# Patient Record
Sex: Male | Born: 1990 | Race: White | Hispanic: No | Marital: Single | Smoking: Current every day smoker
Health system: Southern US, Community
[De-identification: ages and names within clinical notes are randomized; demographics above are authoritative.]

---
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
## 2015-10-18 ENCOUNTER — Encounter (HOSPITAL_COMMUNITY): Payer: Self-pay | Admitting: Emergency Medicine

## 2015-10-18 DIAGNOSIS — F17200 Nicotine dependence, unspecified, uncomplicated: Secondary | ICD-10-CM | POA: Insufficient documentation

## 2015-10-18 DIAGNOSIS — K2970 Gastritis, unspecified, without bleeding: Secondary | ICD-10-CM | POA: Insufficient documentation

## 2015-10-18 LAB — URINALYSIS, ROUTINE W REFLEX MICROSCOPIC
GLUCOSE, UA: NEGATIVE mg/dL
HGB URINE DIPSTICK: NEGATIVE
KETONES UR: 15 mg/dL — AB
LEUKOCYTES UA: NEGATIVE
Nitrite: NEGATIVE
PROTEIN: 30 mg/dL — AB
Specific Gravity, Urine: 1.036 — ABNORMAL HIGH (ref 1.005–1.030)
pH: 6 (ref 5.0–8.0)

## 2015-10-18 LAB — COMPREHENSIVE METABOLIC PANEL
ALK PHOS: 62 U/L (ref 38–126)
ALT: 13 U/L — AB (ref 17–63)
AST: 18 U/L (ref 15–41)
Albumin: 4.2 g/dL (ref 3.5–5.0)
Anion gap: 8 (ref 5–15)
BUN: 16 mg/dL (ref 6–20)
CALCIUM: 9.7 mg/dL (ref 8.9–10.3)
CO2: 25 mmol/L (ref 22–32)
CREATININE: 0.8 mg/dL (ref 0.61–1.24)
Chloride: 106 mmol/L (ref 101–111)
Glucose, Bld: 98 mg/dL (ref 65–99)
Potassium: 3.3 mmol/L — ABNORMAL LOW (ref 3.5–5.1)
Sodium: 139 mmol/L (ref 135–145)
Total Bilirubin: 0.7 mg/dL (ref 0.3–1.2)
Total Protein: 7 g/dL (ref 6.5–8.1)

## 2015-10-18 LAB — CBC
HCT: 45.3 % (ref 39.0–52.0)
Hemoglobin: 15.8 g/dL (ref 13.0–17.0)
MCH: 30.7 pg (ref 26.0–34.0)
MCHC: 34.9 g/dL (ref 30.0–36.0)
MCV: 88 fL (ref 78.0–100.0)
PLATELETS: 131 10*3/uL — AB (ref 150–400)
RBC: 5.15 MIL/uL (ref 4.22–5.81)
RDW: 13.1 % (ref 11.5–15.5)
WBC: 8.1 10*3/uL (ref 4.0–10.5)

## 2015-10-18 LAB — URINE MICROSCOPIC-ADD ON: RBC / HPF: NONE SEEN RBC/hpf (ref 0–5)

## 2015-10-18 LAB — LIPASE, BLOOD: Lipase: 23 U/L (ref 11–51)

## 2015-10-18 NOTE — ED Notes (Signed)
To or 

## 2015-10-18 NOTE — ED Notes (Signed)
The  Or is ready for the pt as soon as the cardiology  Is finished.  Pt just voided

## 2015-10-18 NOTE — ED Notes (Signed)
Pt. reports nausea , emesis and RUQ pain onset last week , denies diarrhea or fever .

## 2015-10-19 ENCOUNTER — Emergency Department (HOSPITAL_COMMUNITY)
Admission: EM | Admit: 2015-10-19 | Discharge: 2015-10-19 | Disposition: A | Discharge status: Home / Self Care | Payer: Self-pay | Attending: Emergency Medicine | Admitting: Emergency Medicine

## 2015-10-19 DIAGNOSIS — R112 Nausea with vomiting, unspecified: Secondary | ICD-10-CM

## 2015-10-19 DIAGNOSIS — K2970 Gastritis, unspecified, without bleeding: Secondary | ICD-10-CM

## 2015-10-19 MED ORDER — ONDANSETRON 4 MG PO TBDP: 4.0000 mg | ORAL_TABLET | Freq: Three times a day (TID) | ORAL | Status: AC | PRN

## 2015-10-19 MED ORDER — OMEPRAZOLE 20 MG PO CPDR: 20.0000 mg | DELAYED_RELEASE_CAPSULE | Freq: Two times a day (BID) | ORAL | Status: AC

## 2015-10-19 MED ORDER — ONDANSETRON 4 MG PO TBDP
4.0000 mg | ORAL_TABLET | Freq: Once | ORAL | Status: AC
  Administered 2015-10-19: 4 mg via ORAL
  Filled 2015-10-19: qty 1

## 2015-10-19 MED ORDER — PANTOPRAZOLE SODIUM 40 MG PO TBEC
40.0000 mg | DELAYED_RELEASE_TABLET | Freq: Every day | ORAL | Status: DC
  Administered 2015-10-19: 40 mg via ORAL
  Filled 2015-10-19: qty 1

## 2015-10-19 NOTE — ED Provider Notes (Signed)
CSN: [id number]     Arrival date & time 10/18/15  2128 History  By signing my name below, I, Bethel BornBritney McCollum, attest that this documentation has been prepared under the direction and in the presence of Rolland PorterMark Harshil Cavallaro, MD. Electronically Signed: Bethel BornBritney McCollum, ED Scribe. 10/19/2015. 1:06 AM   Chief Complaint  Patient presents with  . Abdominal Pain  . Emesis   The history is provided by the patient. No language interpreter was used.   Mark Kaiser is a 25 y.o. male who presents to the Emergency Department complaining of recurrent nausea and vomiting with onset yesterday. Pt states that he is unable to eat more than 3 bites of solid food without nausea and emesis, though he is able to tolerate candy. He states that he has similar symptoms near monthly that last for 3-4 weeks at a time. He associates his symptoms with his history of heavy marijuana use. Prior to 1 week ago he smoked an ounce per week for the last 8 years. 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 He no longer uses alcohol. He takes 1000 mg of ibuprofen approximately once per week for headaches. He drinks "at least 10 cans" of [facility] daily.  History reviewed. No pertinent past medical history. History reviewed. No pertinent past surgical history. No family history on file. Social History  Substance Use Topics  . Smoking status: Current Every Day Smoker  . Smokeless tobacco: None  . Alcohol Use: No    Review of Systems  Constitutional: Negative for fever, chills, diaphoresis, appetite change and fatigue.  HENT: Negative for mouth sores, sore throat and trouble swallowing.   Eyes: Negative for visual disturbance.  Respiratory: Negative for cough, chest tightness, shortness of breath and wheezing.   Cardiovascular: Negative for chest pain.  Gastrointestinal: Positive for nausea and vomiting. Negative for abdominal pain, diarrhea and abdominal distention.  Endocrine: Negative for polydipsia,  polyphagia and polyuria.  Genitourinary: Negative for dysuria, frequency and hematuria.  Musculoskeletal: Negative for gait problem.  Skin: Negative for color change, pallor and rash.  Neurological: Negative for dizziness, syncope, light-headedness and headaches.  Hematological: Does not bruise/bleed easily.  Psychiatric/Behavioral: Negative for behavioral problems and confusion.   Allergies  Bee venom  Home Medications   Prior to Admission medications   Medication Sig Start Date End Date Taking? Authorizing Provider  omeprazole (PRILOSEC) 20 MG capsule Take 1 capsule (20 mg total) by mouth 2 (two) times daily. 10/19/15   Rolland PorterMark Krizia Flight, MD  ondansetron (ZOFRAN ODT) 4 MG disintegrating tablet Take 1 tablet (4 mg total) by mouth every 8 (eight) hours as needed for nausea. 10/19/15   Rolland PorterMark Godson Pollan, MD   BP 126/85 mmHg  Pulse 61  Temp(Src) 98.6 F (37 C) (Oral)  Resp 18  Ht 5\' 6"  (1.676 m)  Wt 122 lb 1 oz (55.367 kg)  BMI 19.71 kg/m2  SpO2 100% Physical Exam  Constitutional: He is oriented to person, place, and time. He appears well-developed and well-nourished. No distress.  HENT:  Head: Normocephalic.  Eyes: Conjunctivae are normal. Pupils are equal, round, and reactive to light. No scleral icterus.  Neck: Normal range of motion. Neck supple. No thyromegaly present.  Cardiovascular: Normal rate and regular rhythm.  Exam reveals no gallop and no friction rub.   No murmur heard. Pulmonary/Chest: Effort normal and breath sounds normal. No respiratory distress. He has no wheezes. He has no rales.  Abdominal: Soft. Bowel sounds are normal. He exhibits no  distension. There is no tenderness. There is no rebound.  Musculoskeletal: Normal range of motion.  Neurological: He is alert and oriented to person, place, and time.  Skin: Skin is warm and dry. No rash noted.  Psychiatric: He has a normal mood and affect. His behavior is normal.    ED Course  Procedures (including critical care  time) DIAGNOSTIC STUDIES: Oxygen Saturation is 100% on RA,  normal by my interpretation.    COORDINATION OF CARE: 1:02 AM Discussed treatment plan which includes lab work, antiemetic medication, and an antacid with pt at bedside and pt agreed to plan.  Labs Review Labs Reviewed  COMPREHENSIVE METABOLIC PANEL - Abnormal; Notable for the following:    Potassium 3.3 (*)    ALT 13 (*)    All other components within normal limits  CBC - Abnormal; Notable for the following:    Platelets 131 (*)    All other components within normal limits  URINALYSIS, ROUTINE W REFLEX MICROSCOPIC (NOT AT [facility]) - Abnormal; Notable for the following:    Color, Urine AMBER (*)    Specific Gravity, Urine 1.036 (*)    Bilirubin Urine SMALL (*)    Ketones, ur 15 (*)    Protein, ur 30 (*)    All other components within normal limits  URINE MICROSCOPIC-ADD ON - Abnormal; Notable for the following:    Squamous Epithelial / LPF 0-5 (*)    Bacteria, UA RARE (*)    Crystals CA OXALATE CRYSTALS (*)    All other components within normal limits  LIPASE, BLOOD    Imaging Review No results found. I have personally reviewed and evaluated these lab results as part of my medical decision-making.   EKG Interpretation None      MDM   Final diagnoses:  Non-intractable vomiting with nausea, vomiting of unspecified type  Gastritis   Recurrent vomiting in a young male that abuses THC, caffeine, and anti-inflammatory medications. Normal enzymes and reassuring labs. Plan: I talked to him about THC hyperemesis. We talked about avoiding alcohol tobacco caffeine anti-inflammatories. Proton pump inhibitor and Zofran. Primary care follow-up.   Rolland PorterMark Akin Yi, MD 10/19/15 0111

## 2015-10-19 NOTE — Discharge Instructions (Signed)
Read about "THC Hyperemesis" online.  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 Stop caffeine, do not use Motrin. Tylenol only for pain. Prilosec until you have gone one week without vomiting or pain, you may stop. Zofran as needed for nausea  Gastritis, Adult Gastritis is soreness and swelling (inflammation) of the lining of the stomach. Gastritis can develop as a sudden onset (acute) or long-term (chronic) condition. If gastritis is not treated, it can lead to stomach bleeding and ulcers. CAUSES  Gastritis occurs when the stomach lining is weak or damaged. Digestive juices from the stomach then inflame the weakened stomach lining. The stomach lining may be weak or damaged due to viral or bacterial infections. One common bacterial infection is the Helicobacter pylori infection. Gastritis can also result from excessive alcohol consumption, taking certain medicines, or having too much acid in the stomach.  SYMPTOMS  In some cases, there are no symptoms. When symptoms are present, they may include:  Pain or a burning sensation in the upper abdomen.  Nausea.  Vomiting.  An uncomfortable feeling of fullness after eating. DIAGNOSIS  Your caregiver may suspect you have gastritis based on your symptoms and a physical exam. To determine the cause of your gastritis, your caregiver may perform the following:  Blood or stool tests to check for the H pylori bacterium.  Gastroscopy. A thin, flexible tube (endoscope) is passed down the esophagus and into the stomach. The endoscope has a light and camera on the end. Your caregiver uses the endoscope to view the inside of the stomach.  Taking a tissue sample (biopsy) from the stomach to examine under a microscope. TREATMENT  Depending on the cause of your gastritis, medicines may be prescribed. If you have a bacterial infection, such as an H pylori infection, antibiotics may be given. If your gastritis is caused by too much acid in the  stomach, H2 blockers or antacids may be given. Your caregiver may recommend that you stop taking aspirin, ibuprofen, or other nonsteroidal anti-inflammatory drugs (NSAIDs). HOME CARE INSTRUCTIONS  Only take over-the-counter or prescription medicines as directed by your caregiver.  If you were given antibiotic medicines, take them as directed. Finish them even if you start to feel better.  Drink enough fluids to keep your urine clear or pale yellow.  Avoid foods and drinks that make your symptoms worse, such as:  Caffeine or alcoholic drinks.  Chocolate.  Peppermint or mint flavorings.  Garlic and onions.  Spicy foods.  Citrus fruits, such as oranges, lemons, or limes.  Tomato-based foods such as sauce, chili, salsa, and pizza.  Fried and fatty foods.  Eat small, frequent meals instead of large meals. SEEK IMMEDIATE MEDICAL CARE IF:   You have black or dark red stools.  You vomit blood or material that looks like coffee grounds.  You are unable to keep fluids down.  Your abdominal pain gets worse.  You have a fever.  You do not feel better after 1 week.  You have any other questions or concerns. MAKE SURE YOU:  Understand these instructions.  Will watch your condition.  Will get help right away if you are not doing well or get worse.   This information is not intended to replace advice given to you by your health care provider. Make sure you discuss any questions you have with your health care provider.   Document Released: 03/30/2001 Document Revised: 10/05/2011 Document Reviewed: 05/19/2011 Elsevier Interactive Patient Education [employer]

## 2015-10-23 ENCOUNTER — Emergency Department (HOSPITAL_COMMUNITY): Payer: Self-pay

## 2015-10-23 ENCOUNTER — Emergency Department (HOSPITAL_COMMUNITY)
Admission: EM | Admit: 2015-10-23 | Discharge: 2015-10-24 | Disposition: A | Discharge status: Home / Self Care | Payer: Self-pay | Attending: Emergency Medicine | Admitting: Emergency Medicine

## 2015-10-23 DIAGNOSIS — M25472 Effusion, left ankle: Secondary | ICD-10-CM | POA: Insufficient documentation

## 2015-10-23 DIAGNOSIS — F17200 Nicotine dependence, unspecified, uncomplicated: Secondary | ICD-10-CM | POA: Insufficient documentation

## 2015-10-23 DIAGNOSIS — S0990XA Unspecified injury of head, initial encounter: Secondary | ICD-10-CM

## 2015-10-23 DIAGNOSIS — W1789XA Other fall from one level to another, initial encounter: Secondary | ICD-10-CM | POA: Insufficient documentation

## 2015-10-23 DIAGNOSIS — Y999 Unspecified external cause status: Secondary | ICD-10-CM | POA: Insufficient documentation

## 2015-10-23 DIAGNOSIS — M25572 Pain in left ankle and joints of left foot: Secondary | ICD-10-CM

## 2015-10-23 DIAGNOSIS — S30810A Abrasion of lower back and pelvis, initial encounter: Secondary | ICD-10-CM | POA: Insufficient documentation

## 2015-10-23 DIAGNOSIS — Y9339 Activity, other involving climbing, rappelling and jumping off: Secondary | ICD-10-CM | POA: Insufficient documentation

## 2015-10-23 DIAGNOSIS — S30811A Abrasion of abdominal wall, initial encounter: Secondary | ICD-10-CM | POA: Insufficient documentation

## 2015-10-23 DIAGNOSIS — W19XXXA Unspecified fall, initial encounter: Secondary | ICD-10-CM

## 2015-10-23 DIAGNOSIS — M7989 Other specified soft tissue disorders: Secondary | ICD-10-CM | POA: Insufficient documentation

## 2015-10-23 DIAGNOSIS — Y929 Unspecified place or not applicable: Secondary | ICD-10-CM | POA: Insufficient documentation

## 2015-10-23 LAB — I-STAT CG4 LACTIC ACID, ED: LACTIC ACID, VENOUS: 4.32 mmol/L — AB (ref 0.5–1.9)

## 2015-10-23 MED ORDER — SODIUM CHLORIDE 0.9 % IV BOLUS (SEPSIS)
1000.0000 mL | Freq: Once | INTRAVENOUS | Status: AC
  Administered 2015-10-23: 1000 mL via INTRAVENOUS

## 2015-10-23 MED ORDER — IOPAMIDOL (ISOVUE-300) INJECTION 61%
INTRAVENOUS | Status: DC
Start: 2015-10-23 — End: 2015-10-24
  Filled 2015-10-23: qty 100

## 2015-10-23 NOTE — ED Notes (Signed)
Patient arrives with complaint of severe left foot pain and moderate back pain secondary to fall from 20 foot rock face today. States he was climbing and lost his grip. Believes he may have been knocked out because he doesn't remember landing on the ground. Abrasions noted to lower and upper back and RLQ abdomen. Patient hyperventilating in triage "because of the pain".

## 2015-10-23 NOTE — Progress Notes (Signed)
Orthopedic Tech Progress Note Patient Details:  Octaviano BattyColey Patnaude 08-25-1990 [id number] Level 2 trauma ortho visit. Patient ID: Octaviano BattyColey Catalano, male   DOB: 08-25-1990, 25 y.o.   MRN: [id number]   Jennye MoccasinHughes, Javarius Tsosie Craig 10/23/2015, 10:53 PM

## 2015-10-23 NOTE — Progress Notes (Signed)
   10/23/15 2259  Clinical Encounter Type  Visited With Patient not available;Health care provider  Visit Type Initial;ED;Trauma  Referral From Nurse   Chaplain responded to a level II trauma in the ED. Family is present at patient's bedside. Chaplain was paged away, and our services are available as needed.   Alda PonderAdam M Bryse Blanchette, Chaplain 10/23/2015 11:00 PM

## 2015-10-23 NOTE — ED Provider Notes (Signed)
CSN: [id number]     Arrival date & time 10/23/15  2225 History   First MD Initiated Contact with Patient 10/23/15 2249     Chief Complaint  Patient presents with  . Fall  . Foot Pain  . Back Pain     (Consider location/radiation/quality/duration/timing/severity/associated sxs/prior Treatment) HPI   Mark Kaiser is a 25 y.o. male, with a history of frequent marijuana use, presenting to the ED with injuries from a fall from an estimated 15-20 ft while rock climbing. Does not know what made him fall. Does not remember if he hit his head. His brother apparently told him that he lost consciousness, but did not tell him for how long. States, "I think I landed right on my back." Denies drugs or alcohol use. States the incident happened "sometime this afternoon," and when asked what he has been doing since the fall he states, "I was just sitting in my car." Denies nausea/vomiting, abdominal pain, pelvic pain, chest pain, shortness of breath, neck pain, neuro deficits, or any other complaints.    No past medical history on file. No past surgical history on file. No family history on file. Social History  Substance Use Topics  . Smoking status: Current Every Day Smoker  . Smokeless tobacco: Not on file  . Alcohol Use: No    Review of Systems  Respiratory: Negative for shortness of breath.   Gastrointestinal: Negative for nausea, vomiting and abdominal pain.  Musculoskeletal: Positive for back pain, joint swelling (Left ankle) and arthralgias (Left ankle). Negative for neck pain.  Skin: Positive for wound (Multiple abrasions).  Neurological: Negative for dizziness, weakness, light-headedness and numbness.  All other systems reviewed and are negative.     Allergies  Bee venom  Home Medications   Prior to Admission medications   Medication Sig Start Date End Date Taking? Authorizing Provider  naproxen (NAPROSYN) 500 MG tablet Take 1 tablet (500 mg total) by mouth 2 (two) times  daily. 10/24/15   Shawn C Joy, PA-C  omeprazole (PRILOSEC) 20 MG capsule Take 1 capsule (20 mg total) by mouth 2 (two) times daily. 10/19/15   Rolland Porter, MD  ondansetron (ZOFRAN ODT) 4 MG disintegrating tablet Take 1 tablet (4 mg total) by mouth every 8 (eight) hours as needed for nausea. 10/19/15   Rolland Porter, MD  oxyCODONE-acetaminophen (PERCOCET/ROXICET) 5-325 MG tablet Take 1 tablet by mouth every 6 (six) hours as needed for severe pain. 10/24/15   Shawn C Joy, PA-C   BP 121/99 mmHg  Pulse 88  Temp(Src) 98.5 F (36.9 C) (Oral)  Resp 26  Ht 5\' 6"  (1.676 m)  Wt 55.339 kg  BMI 19.70 kg/m2  SpO2 100% Physical Exam  Constitutional: He is oriented to person, place, and time. He appears well-developed and well-nourished. No distress.  HENT:  Head: Normocephalic and atraumatic.  Mouth/Throat: Oropharynx is clear and moist.  Eyes: Conjunctivae and EOM are normal. Pupils are equal, round, and reactive to light.  Neck: Normal range of motion. Neck supple. No tracheal deviation present.  Cardiovascular: Normal rate, regular rhythm, normal heart sounds and intact distal pulses.   Pulmonary/Chest: Effort normal and breath sounds normal. No respiratory distress.  No noted chest trauma. No tenderness, deformity, crepitus, or swelling.  Abdominal: Soft. There is no tenderness. There is no guarding.  Superficial abrasions to the RUQ. No bruising or tenderness.   Genitourinary:  No gross blood or stool burden. No rectal tenderness. Normal rectal tone. RN served as Biomedical engineer during the rectal  exam.  Musculoskeletal: He exhibits edema and tenderness.  Swelling and tenderness to left dorsal foot and left lateral ankle. No deformity. Tenderness to the midline lumbar spine without deformity, step offs, or misalignment. Small, superficial abrasion present. Full ROM in all other extremities. ROM in spine deferred until after clear imaging. Full range of motion in the patient's spine.  Lymphadenopathy:    He has  no cervical adenopathy.  Neurological: He is alert and oriented to person, place, and time. He has normal reflexes.  No sensory deficits. Strength 5/5 in all extremities. Coordination intact. Cranial nerves III-XII grossly intact.  Skin: Skin is warm and dry. He is not diaphoretic.  Psychiatric: His behavior is normal. His mood appears anxious.  Nursing note and vitals reviewed.   ED Course  Procedures (including critical care time) Labs Review Labs Reviewed  URINALYSIS, ROUTINE W REFLEX MICROSCOPIC (NOT AT [facility]) - Abnormal; Notable for the following:    APPearance TURBID (*)    Specific Gravity, Urine 1.035 (*)    Ketones, ur 15 (*)    All other components within normal limits  URINE RAPID DRUG SCREEN, HOSP PERFORMED - Abnormal; Notable for the following:    Tetrahydrocannabinol POSITIVE (*)    All other components within normal limits  CBC WITH DIFFERENTIAL/PLATELET - Abnormal; Notable for the following:    Platelets 113 (*)    All other components within normal limits  COMPREHENSIVE METABOLIC PANEL - Abnormal; Notable for the following:    Potassium 3.4 (*)    Total Protein 8.4 (*)    All other components within normal limits  URINE MICROSCOPIC-ADD ON - Abnormal; Notable for the following:    Squamous Epithelial / LPF 0-5 (*)    Bacteria, UA RARE (*)    All other components within normal limits  I-STAT CG4 LACTIC ACID, ED - Abnormal; Notable for the following:    Lactic Acid, Venous 4.32 (*)    All other components within normal limits  I-STAT CG4 LACTIC ACID, ED - Abnormal; Notable for the following:    Lactic Acid, Venous 0.45 (*)    All other components within normal limits  ETHANOL  POC OCCULT BLOOD, ED    Imaging Review Dg Chest 2 View  10/23/2015  CLINICAL DATA:  Fall 12-15 foot from cliff. Shortness of breath. Possible loss of consciousness. EXAM: CHEST  2 VIEW COMPARISON:  None. FINDINGS: The cardiomediastinal contours are normal. The lungs are clear. Pulmonary  vasculature is normal. No consolidation, pleural effusion, or pneumothorax. No acute osseous abnormalities are seen. IMPRESSION: No acute traumatic abnormalities. Electronically Signed   By: Rubye OaksMelanie  Ehinger M.D.   On: 10/23/2015 23:47   Dg Thoracic Spine 2 View  10/23/2015  CLINICAL DATA:  Fall 12-15 foot from cliff. Pain. Shortness of breath today. EXAM: THORACIC SPINE 2 VIEWS COMPARISON:  None. FINDINGS: The alignment is maintained. Vertebral body heights are maintained. No significant disc space narrowing. Posterior elements appear intact. No evidence of acute fracture. There is no paravertebral soft tissue abnormality. IMPRESSION: Negative radiographs of the thoracic spine. Electronically Signed   By: Rubye OaksMelanie  Ehinger M.D.   On: 10/23/2015 23:50   Dg Lumbar Spine Complete  10/23/2015  CLINICAL DATA:  Fall 12-15 foot from cliff. Pain. Possible loss of consciousness. EXAM: LUMBAR SPINE - COMPLETE 4+ VIEW COMPARISON:  None. FINDINGS: The alignment is maintained. Vertebral body heights are normal. There is no listhesis. The posterior elements are intact. Disc spaces are preserved. No fracture. Sacroiliac joints are symmetric and  normal. IMPRESSION: Negative radiographs of the lumbar spine. Electronically Signed   By: Rubye OaksMelanie  Ehinger M.D.   On: 10/23/2015 23:51   Dg Ankle Complete Left  10/23/2015  CLINICAL DATA:  Fall 12-15 foot from cliff. Left foot and ankle pain. EXAM: LEFT ANKLE COMPLETE - 3+ VIEW COMPARISON:  None. FINDINGS: There is no evidence of fracture, dislocation, or joint effusion. The ankle mortise is preserved. There is no evidence of arthropathy or other focal bone abnormality. Soft tissues are unremarkable. IMPRESSION: Negative radiographs of the left ankle. Electronically Signed   By: Rubye OaksMelanie  Ehinger M.D.   On: 10/23/2015 23:48   Ct Head Wo Contrast  10/24/2015  CLINICAL DATA:  Fall off cliff, possible loss of consciousness. EXAM: CT HEAD WITHOUT CONTRAST CT CERVICAL SPINE WITHOUT  CONTRAST TECHNIQUE: Multidetector CT imaging of the head and cervical spine was performed following the standard protocol without intravenous contrast. Multiplanar CT image reconstructions of the cervical spine were also generated. COMPARISON:  None. FINDINGS: CT HEAD FINDINGS No intracranial hemorrhage, mass effect, or midline shift. No hydrocephalus. The basilar cisterns are patent. No evidence of territorial infarct. No intracranial fluid collection. Calvarium is intact. Included paranasal sinuses and mastoid air cells are well aerated. CT CERVICAL SPINE FINDINGS Mild motion artifact. No fracture or acute subluxation. The dens is intact. There are no jumped or perched facets. Vertebral body heights and intervertebral disc spaces are maintained. No prevertebral soft tissue edema. IMPRESSION: 1. Unremarkable noncontrast head CT. 2. Unremarkable CT of the cervical spine. Electronically Signed   By: Rubye OaksMelanie  Ehinger M.D.   On: 10/24/2015 00:37   Ct Cervical Spine Wo Contrast  10/24/2015  CLINICAL DATA:  Fall off cliff, possible loss of consciousness. EXAM: CT HEAD WITHOUT CONTRAST CT CERVICAL SPINE WITHOUT CONTRAST TECHNIQUE: Multidetector CT imaging of the head and cervical spine was performed following the standard protocol without intravenous contrast. Multiplanar CT image reconstructions of the cervical spine were also generated. COMPARISON:  None. FINDINGS: CT HEAD FINDINGS No intracranial hemorrhage, mass effect, or midline shift. No hydrocephalus. The basilar cisterns are patent. No evidence of territorial infarct. No intracranial fluid collection. Calvarium is intact. Included paranasal sinuses and mastoid air cells are well aerated. CT CERVICAL SPINE FINDINGS Mild motion artifact. No fracture or acute subluxation. The dens is intact. There are no jumped or perched facets. Vertebral body heights and intervertebral disc spaces are maintained. No prevertebral soft tissue edema. IMPRESSION: 1. Unremarkable  noncontrast head CT. 2. Unremarkable CT of the cervical spine. Electronically Signed   By: Rubye OaksMelanie  Ehinger M.D.   On: 10/24/2015 00:37   Ct Abdomen Pelvis W Contrast  10/24/2015  CLINICAL DATA:  Fall 12-20 feet while rock climbing.  Back pain. EXAM: CT ABDOMEN AND PELVIS WITH CONTRAST TECHNIQUE: Multidetector CT imaging of the abdomen and pelvis was performed using the standard protocol following bolus administration of intravenous contrast. CONTRAST:  100 cc Isovue-300 IV COMPARISON:  None. FINDINGS: Lower chest: The included lung bases are clear. Included lower ribs are intact. Liver: Homogeneous attenuation. No traumatic injury. Minimal focal fatty infiltration adjacent with falciform ligament. No perihepatic fluid. Hepatobiliary: Gallbladder decompressed.  No biliary dilatation. Pancreas: No acute traumatic injury. Limited assessment due to adjacent decompressed small bowel loops. Spleen: Homogeneous attenuation. No traumatic injury. No perisplenic fluid. Adrenal glands: No hemorrhage or traumatic injury.  No nodule. Kidneys: Symmetric renal enhancement. No hydronephrosis. No perinephric stranding or traumatic injury. Symmetric renal excretion on delayed phase imaging. Stomach/Bowel: Stomach is decompressed. There are no dilated  or thickened small bowel loops. Small volume of stool throughout the colon without colonic wall thickening. The appendix is normal. Vascular/Lymphatic: No retroperitoneal fluid or adenopathy. Abdominal aorta is normal in caliber. Reproductive: Normal. Bladder: Physiologically distended, no wall thickening. Other: No free air, free fluid, or intra-abdominal fluid collection. No confluent hematoma. Subcutaneous tissues appear normal. Musculoskeletal: There are no acute or suspicious osseous abnormalities. Lumbar spine and bony pelvis are intact. IMPRESSION: No acute traumatic injury to the abdomen or pelvis. Electronically Signed   By: Rubye Oaks M.D.   On: 10/24/2015 00:47   Dg  Foot Complete Left  10/23/2015  CLINICAL DATA:  Fall 12-15 foot from cliff. Left foot and ankle pain. EXAM: LEFT FOOT - COMPLETE 3+ VIEW COMPARISON:  None. FINDINGS: There is no evidence of fracture or dislocation. There is no evidence of arthropathy or other focal bone abnormality. Soft tissues are unremarkable. IMPRESSION: Negative radiographs of the left foot. Electronically Signed   By: Rubye Oaks M.D.   On: 10/23/2015 23:49   I have personally reviewed and evaluated these images and lab results as part of my medical decision-making.   EKG Interpretation   Date/Time:  Thursday October 23 2015 23:00:06 EDT Ventricular Rate:  71 PR Interval:    QRS Duration: 93 QT Interval:  473 QTC Calculation: 485 R Axis:   80 Text Interpretation:  Atrial fibrillation RSR' in V1 or V2, probably  normal variant ST elevation suggests acute pericarditis Borderline  prolonged QT interval No old tracing to compare - artifact makes  interpretation difficult Confirmed by Karma Ganja  MD, MARTHA [phone]) on  10/23/2015 11:11:09 PM       Orders Placed This Encounter  Procedures  . CT Head Wo Contrast  . CT Cervical Spine Wo Contrast  . CT Abdomen Pelvis W Contrast  . DG Chest 2 View  . DG Ankle Complete Left  . DG Foot Complete Left  . DG Lumbar Spine Complete  . DG Thoracic Spine 2 View  . Urinalysis, Routine w reflex microscopic  . Urine rapid drug screen (hosp performed)  . CBC with Differential  . Comprehensive metabolic panel  . Ethanol  . Urine microscopic-add on  . Diet NPO time specified  . Apply ice  . Apply ASO ankle  . Crutches  . POC occult blood, ED Provider will collect  . I-Stat CG4 Lactic Acid, ED  . ED EKG  . EKG 12-Lead    MDM   Final diagnoses:  Fall, initial encounter  Ankle pain, left  Head injury, initial encounter    Mark Kaiser presents with a fall while rock climbing this afternoon.  Findings and plan of care discussed with Jerelyn Scott, MD. Dr. Karma Ganja  personally evaluated and examined this patient.  Patient has no neurologic deficits on exam. No evidence of life-threatening injury. Patient has no evidence of serious head, spine, chest, abdominal, or pelvic injury. While patient was being evaluated, he had what appeared to be vasovagal episode. Patient anxious throughout the entire initial interview. Patient became tachycardic, more anxious, diaphoretic, and repeatedly requesting water. After patient was laid flat, he recovered from this episode within a minute or 2. After this episode, patient had no other abnormalities throughout his time in the ED. Upon reassessment, patient is calm, well-appearing, not tachycardic, not hypotensive, SPO2 of 100%, and states his pain is well-controlled with the ibuprofen. Imaging studies are negative for acute abnormalities. Patient reexamined and no additional injuries were found. Patient put in an ankle splint,  given crutches, and advised to follow-up with orthopedics. Patient ambulated using crutches without difficulty. Home care and return precautions discussed. Patient voiced understanding of these instructions, accepts the plan, and is comfortable with discharge.   Filed Vitals:   10/23/15 2232 10/23/15 2245 10/23/15 2250 10/23/15 2309  BP: 121/99 80/39  120/70  Pulse: 88 59  79  Temp: 98.5 F (36.9 C)     TempSrc: Oral     Resp: 26 15  14   Height: 5\' 6"  (1.676 m)     Weight: 55.339 kg     SpO2: 100% 100% 100% 99%   Filed Vitals:   10/23/15 2307 10/23/15 2309 10/24/15 0015 10/24/15 0030  BP: 120/70 120/70 108/70 123/72  Pulse: 87 79 85 88  Temp:      TempSrc:      Resp: 20 14 15 18   Height:      Weight:      SpO2: 99% 99% 100% 100%   Filed Vitals:   10/24/15 0130 10/24/15 0200 10/24/15 0215 10/24/15 0246  BP: 116/70 113/65 108/64   Pulse: 78 84 74   Temp:    98.6 F (37 C)  TempSrc:    Oral  Resp: 14 14 19    Height:      Weight:      SpO2: 99% 99% 99%      Anselm Pancoast,  PA-C 10/24/15 0558  Jerelyn Scott, MD 10/24/15 1807

## 2015-10-24 LAB — URINALYSIS, ROUTINE W REFLEX MICROSCOPIC
Bilirubin Urine: NEGATIVE
GLUCOSE, UA: NEGATIVE mg/dL
HGB URINE DIPSTICK: NEGATIVE
Ketones, ur: 15 mg/dL — AB
LEUKOCYTES UA: NEGATIVE
Nitrite: NEGATIVE
PH: 7.5 (ref 5.0–8.0)
Protein, ur: NEGATIVE mg/dL
Specific Gravity, Urine: 1.035 — ABNORMAL HIGH (ref 1.005–1.030)

## 2015-10-24 LAB — RAPID URINE DRUG SCREEN, HOSP PERFORMED
AMPHETAMINES: NOT DETECTED
BARBITURATES: NOT DETECTED
BENZODIAZEPINES: NOT DETECTED
Cocaine: NOT DETECTED
Opiates: NOT DETECTED
TETRAHYDROCANNABINOL: POSITIVE — AB

## 2015-10-24 LAB — CBC WITH DIFFERENTIAL/PLATELET
Basophils Absolute: 0 10*3/uL (ref 0.0–0.1)
Basophils Relative: 0 %
EOS ABS: 0 10*3/uL (ref 0.0–0.7)
Eosinophils Relative: 0 %
HEMATOCRIT: 41.8 % (ref 39.0–52.0)
HEMOGLOBIN: 13.1 g/dL (ref 13.0–17.0)
LYMPHS ABS: 1.7 10*3/uL (ref 0.7–4.0)
Lymphocytes Relative: 22 %
MCH: 28.2 pg (ref 26.0–34.0)
MCHC: 31.3 g/dL (ref 30.0–36.0)
MCV: 89.9 fL (ref 78.0–100.0)
MONOS PCT: 7 %
Monocytes Absolute: 0.6 10*3/uL (ref 0.1–1.0)
NEUTROS PCT: 70 %
Neutro Abs: 5.5 10*3/uL (ref 1.7–7.7)
Platelets: 113 10*3/uL — ABNORMAL LOW (ref 150–400)
RBC: 4.65 MIL/uL (ref 4.22–5.81)
RDW: 13.4 % (ref 11.5–15.5)
WBC: 7.8 10*3/uL (ref 4.0–10.5)

## 2015-10-24 LAB — COMPREHENSIVE METABOLIC PANEL
ALBUMIN: 4.5 g/dL (ref 3.5–5.0)
ALT: 17 U/L (ref 17–63)
AST: 27 U/L (ref 15–41)
Alkaline Phosphatase: 65 U/L (ref 38–126)
Anion gap: 11 (ref 5–15)
BUN: 14 mg/dL (ref 6–20)
CHLORIDE: 108 mmol/L (ref 101–111)
CO2: 23 mmol/L (ref 22–32)
CREATININE: 0.94 mg/dL (ref 0.61–1.24)
Calcium: 10.1 mg/dL (ref 8.9–10.3)
GFR calc non Af Amer: >60 mL/min (ref >60)
GLUCOSE: 91 mg/dL (ref 65–99)
POTASSIUM: 3.4 mmol/L — AB (ref 3.5–5.1)
SODIUM: 142 mmol/L (ref 135–145)
Total Bilirubin: 0.6 mg/dL (ref 0.3–1.2)
Total Protein: 8.4 g/dL — ABNORMAL HIGH (ref 6.5–8.1)

## 2015-10-24 LAB — URINE MICROSCOPIC-ADD ON: RBC / HPF: NONE SEEN RBC/hpf (ref 0–5)

## 2015-10-24 LAB — ETHANOL

## 2015-10-24 LAB — POC OCCULT BLOOD, ED: Fecal Occult Bld: NEGATIVE

## 2015-10-24 LAB — I-STAT CG4 LACTIC ACID, ED: Lactic Acid, Venous: 0.45 mmol/L — ABNORMAL LOW (ref 0.5–1.9)

## 2015-10-24 MED ORDER — OXYCODONE-ACETAMINOPHEN 5-325 MG PO TABS: 1.0000 | ORAL_TABLET | Freq: Four times a day (QID) | ORAL | Status: AC | PRN

## 2015-10-24 MED ORDER — NAPROXEN 500 MG PO TABS: 500.0000 mg | ORAL_TABLET | Freq: Two times a day (BID) | ORAL | Status: AC

## 2015-10-24 NOTE — Discharge Instructions (Signed)
You have been seen today for evaluation following a fall. Your imaging showed no abnormalities. Follow up with orthopedics as soon as possible for reevaluation and further management of the left ankle pain. It is likely you sustained a concussion. Refer to the attached literature for information regarding symptoms that are normal following an injury of this type and symptoms that should cause you to return to the ED. Follow up with PCP as needed. Return to ED should symptoms worsen.

## 2015-10-24 NOTE — ED Notes (Signed)
Warm blankets applied

## 2016-02-01 ENCOUNTER — Encounter (HOSPITAL_COMMUNITY): Payer: Self-pay | Admitting: Emergency Medicine

## 2016-02-01 ENCOUNTER — Emergency Department (HOSPITAL_COMMUNITY)
Admission: EM | Admit: 2016-02-01 | Discharge: 2016-02-01 | Disposition: A | Discharge status: Home / Self Care | Payer: Self-pay | Attending: Emergency Medicine | Admitting: Emergency Medicine

## 2016-02-01 DIAGNOSIS — J020 Streptococcal pharyngitis: Secondary | ICD-10-CM | POA: Insufficient documentation

## 2016-02-01 DIAGNOSIS — F17200 Nicotine dependence, unspecified, uncomplicated: Secondary | ICD-10-CM | POA: Insufficient documentation

## 2016-02-01 LAB — RAPID STREP SCREEN (MED CTR MEBANE ONLY): STREPTOCOCCUS, GROUP A SCREEN (DIRECT): NEGATIVE

## 2016-02-01 MED ORDER — PENICILLIN G BENZATHINE 1200000 UNIT/2ML IM SUSP
1.2000 10*6.[IU] | Freq: Once | INTRAMUSCULAR | Status: AC
  Administered 2016-02-01: 1.2 10*6.[IU] via INTRAMUSCULAR
  Filled 2016-02-01: qty 2

## 2016-02-01 MED ORDER — HYDROCODONE-ACETAMINOPHEN 7.5-325 MG/15ML PO SOLN: 15.0000 mL | Freq: Three times a day (TID) | ORAL | 0 refills | Status: AC | PRN

## 2016-02-01 MED ORDER — OXYMETAZOLINE HCL 0.05 % NA SOLN: 1.0000 | Freq: Two times a day (BID) | NASAL | 0 refills | Status: AC

## 2016-02-01 NOTE — ED Provider Notes (Signed)
[facility]-EMERGENCY DEPT Provider Note   CSN: [id number] Arrival date & time: 02/01/16  1122   By signing my name below, I, Clarisse Gouge, attest that this documentation has been prepared under the direction and in the presence of Melburn Hake, [place]. Electronically Signed: Clarisse Gouge, Scribe. 02/01/16. 2:15 PM.   History   Chief Complaint Chief Complaint  Patient presents with  . Sore Throat   The history is provided by the patient. No language interpreter was used.   Mark Kaiser is a 25 y.o. male otherwise healthy who presents to the Emergency Department complaining of gradually worsening sore throat x 6 days. The pt reports associated subjective fever and chills, left neck lymph node swelling, nasal congestion and sinus pain. The pt has taken OTC cold pills with no relief. No sick contacts at home. Denies difficulty swallowing, drooling, trismus, facial swelling, rhinorrhea, wheezing, ear pain, coughing, vomiting, abdominal pain, CP.   History reviewed. No pertinent past medical history.  There are no active problems to display for this patient.   History reviewed. No pertinent surgical history.     Home Medications    Prior to Admission medications   Medication Sig Start Date End Date Taking? Authorizing Provider  HYDROcodone-acetaminophen (HYCET) 7.5-325 mg/15 ml solution Take 15 mLs by mouth every 8 (eight) hours as needed for moderate pain. 02/01/16   Barrett Henle, PA-C  naproxen (NAPROSYN) 500 MG tablet Take 1 tablet (500 mg total) by mouth 2 (two) times daily. 10/24/15   Shawn C Joy, PA-C  omeprazole (PRILOSEC) 20 MG capsule Take 1 capsule (20 mg total) by mouth 2 (two) times daily. 10/19/15   Rolland Porter, MD  ondansetron (ZOFRAN ODT) 4 MG disintegrating tablet Take 1 tablet (4 mg total) by mouth every 8 (eight) hours as needed for nausea. 10/19/15   Rolland Porter, MD  oxyCODONE-acetaminophen (PERCOCET/ROXICET) 5-325 MG tablet Take 1 tablet by mouth every 6 (six) hours  as needed for severe pain. 10/24/15   Shawn C Joy, PA-C  oxymetazoline (AFRIN NASAL SPRAY) 0.05 % nasal spray Place 1 spray into both nostrils 2 (two) times daily. 02/01/16   Barrett Henle, PA-C    Family History No family history on file.  Social History Social History  Substance Use Topics  . Smoking status: Current Every Day Smoker  . Smokeless tobacco: Not on file  . Alcohol use No     Allergies   Bee venom   Review of Systems Review of Systems  Constitutional: Positive for chills and fever (subjective).  HENT: Positive for congestion, rhinorrhea, sinus pressure and sore throat. Negative for drooling, ear pain and trouble swallowing.   Respiratory: Negative for cough and wheezing.   Gastrointestinal: Positive for abdominal pain. Negative for vomiting.  All other systems reviewed and are negative.    Physical Exam Updated Vital Signs BP 128/70 (BP Location: Right Arm)   Pulse 79   Temp 98.8 F (37.1 C) (Oral)   Resp 16   Ht 5\' 5"  (1.651 m)   Wt 60.3 kg   SpO2 100%   BMI 22.13 kg/m   Physical Exam  Constitutional: He is oriented to person, place, and time. He appears well-developed and well-nourished. No distress.  HENT:  Head: Normocephalic and atraumatic.  Right Ear: Tympanic membrane normal.  Left Ear: Tympanic membrane normal.  Nose: Nose normal. No rhinorrhea. Right sinus exhibits no maxillary sinus tenderness and no frontal sinus tenderness. Left sinus exhibits no maxillary sinus tenderness and no frontal sinus tenderness.  Mouth/Throat: Uvula is midline and mucous membranes are normal. No oral lesions. No trismus in the jaw. No uvula swelling. Oropharyngeal exudate and posterior oropharyngeal erythema present. No posterior oropharyngeal edema or tonsillar abscesses. Tonsils are 1+ on the right. Tonsils are 1+ on the left. Tonsillar exudate.  Eyes: Conjunctivae and EOM are normal. Right eye exhibits no discharge. Left eye exhibits no discharge. No  scleral icterus.  Neck: Normal range of motion. Neck supple.  Cardiovascular: Normal rate, regular rhythm, normal heart sounds and intact distal pulses.   Pulmonary/Chest: Effort normal and breath sounds normal. No stridor. No respiratory distress. He has no wheezes. He has no rales. He exhibits no tenderness.  Abdominal: Soft. Bowel sounds are normal. He exhibits no distension and no mass. There is no tenderness. There is no rebound and no guarding.  Musculoskeletal: Normal range of motion. He exhibits no edema.  Lymphadenopathy:    He has cervical adenopathy (bilateral cervical lymphadenopathy).  Neurological: He is alert and oriented to person, place, and time.  Skin: Skin is warm and dry. He is not diaphoretic.  Nursing note and vitals reviewed.    ED Treatments / Results  DIAGNOSTIC STUDIES: Oxygen Saturation is 100% on RA, normal by my interpretation.    COORDINATION OF CARE: 2:15 PM Discussed treatment plan with pt at bedside and pt agreed to plan.   Labs (all labs ordered are listed, but only abnormal results are displayed) Labs Reviewed  RAPID STREP SCREEN (NOT AT [facility])  CULTURE, GROUP A STREP [facility])    EKG  EKG Interpretation None       Radiology No results found.  Procedures Procedures (including critical care time)  Medications Ordered in ED Medications  penicillin g benzathine (BICILLIN LA) 1200000 UNIT/2ML injection 1.2 Million Units (1.2 Million Units Intramuscular Given 02/01/16 1438)     Initial Impression / Assessment and Plan / ED Course  I have reviewed the triage vital signs and the nursing notes.  Pertinent labs & imaging results that were available during my care of the patient were reviewed by me and considered in my medical decision making (see chart for details).  Clinical Course    Patient presents with sore throat, nasal congestion, sinus pressure, subjective fever. Denies trismus, drooling, dysphasia or shortness of breath. VSS. Exam  revealed oral pharyngeal erythema with associated exudate over bilateral tonsils; bilateral submandibular lymphadenopathy. Patient tolerating secretions, no trismus. Lungs clear to auscultation bilaterally. Centor score 3, pt treated with PCN in the ED for suspected strep. Discussed importance of water rehydration. Presentation non concerning for PTA or infxn spread to soft tissue. No trismus or uvula deviation. Specific return precautions discussed. Pt able to drink water in ED without difficulty with intact air way. Recommended PCP follow up.     Final Clinical Impressions(s) / ED Diagnoses   Final diagnoses:  Strep throat    New Prescriptions Discharge Medication List as of 02/01/2016  2:32 PM    START taking these medications   Details  HYDROcodone-acetaminophen (HYCET) 7.5-325 mg/15 ml solution Take 15 mLs by mouth every 8 (eight) hours as needed for moderate pain., Starting Sun 02/01/2016, Print    oxymetazoline (AFRIN NASAL SPRAY) 0.05 % nasal spray Place 1 spray into both nostrils 2 (two) times daily., Starting Sun 02/01/2016, Print        I personally performed the services described in this documentation, which was scribed in my presence. The recorded information has been reviewed and is accurate.    Barrett HenleNicole Elizabeth Nadeau,  PA-C 02/01/16 1639    Loren Racer, MD 02/02/16 [phone]

## 2016-02-01 NOTE — Discharge Instructions (Signed)
Take your medications as prescribed as needed for additional pain relief. Continue drinking fluids at home to remain hydrated. He may also take Tylenol or ibuprofen as prescribed over-the-counter as needed for additional pain relief. He has been treated for strep throat in the emergency department today however he remain contagious for 24 hours after receiving your shot of antibiotics. Please follow up with a primary care provider from the Resource Guide provided below in 4-5 days if symptoms have not improved.  Please return to the Emergency Department if symptoms worsen or new onset of fever, facial/neck swelling, difficulty breathing, difficulty swallowing resulting in drooling, unable to open mouth completely, wheezing, voice change, unable to keep fluids down, vomiting.

## 2016-02-01 NOTE — ED Notes (Signed)
Declined W/C at D/C and was escorted to lobby by RN. 

## 2016-02-03 LAB — CULTURE, GROUP A STREP (THRC)

## 2018-07-06 IMAGING — DX DG LUMBAR SPINE COMPLETE 4+V
5 series · 5 of 5 positions shown · non-contrast
Comparison: None.

CLINICAL DATA: Fall 12-15 foot from [place]. Pain. Possible loss of
consciousness.

EXAM:
LUMBAR SPINE - COMPLETE 4+ VIEW

[l-spine ap]
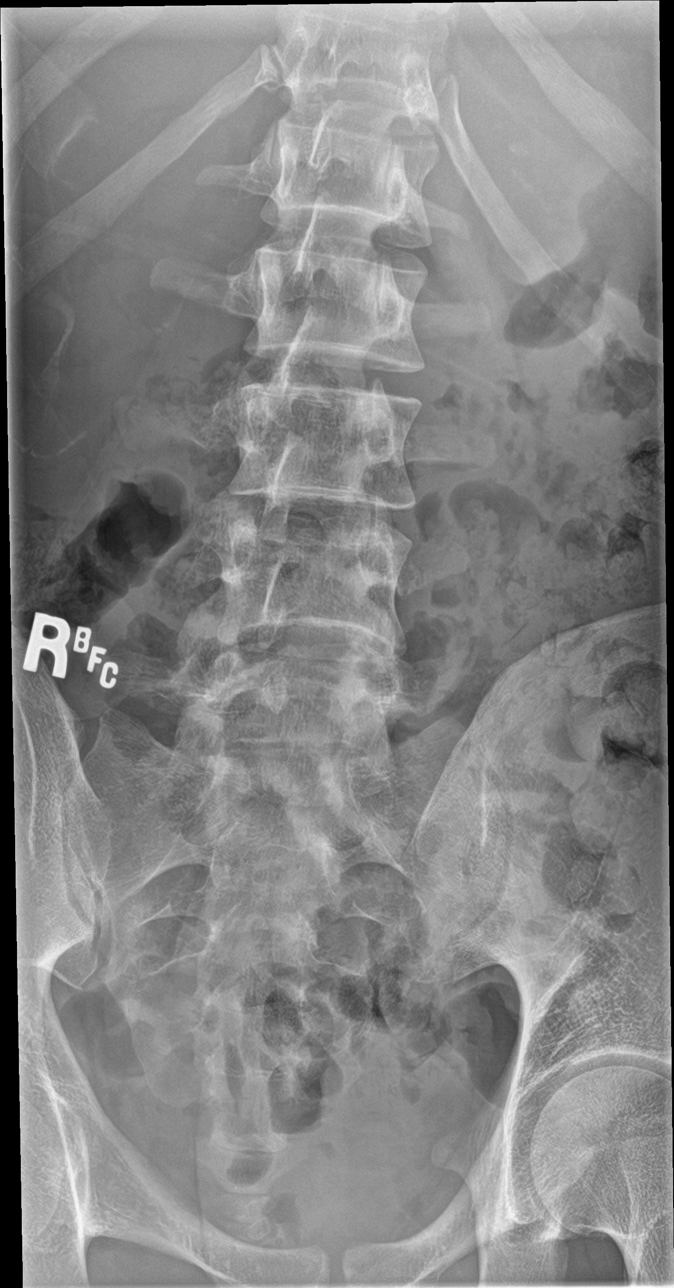

[l-spine obl (1 of 2)]
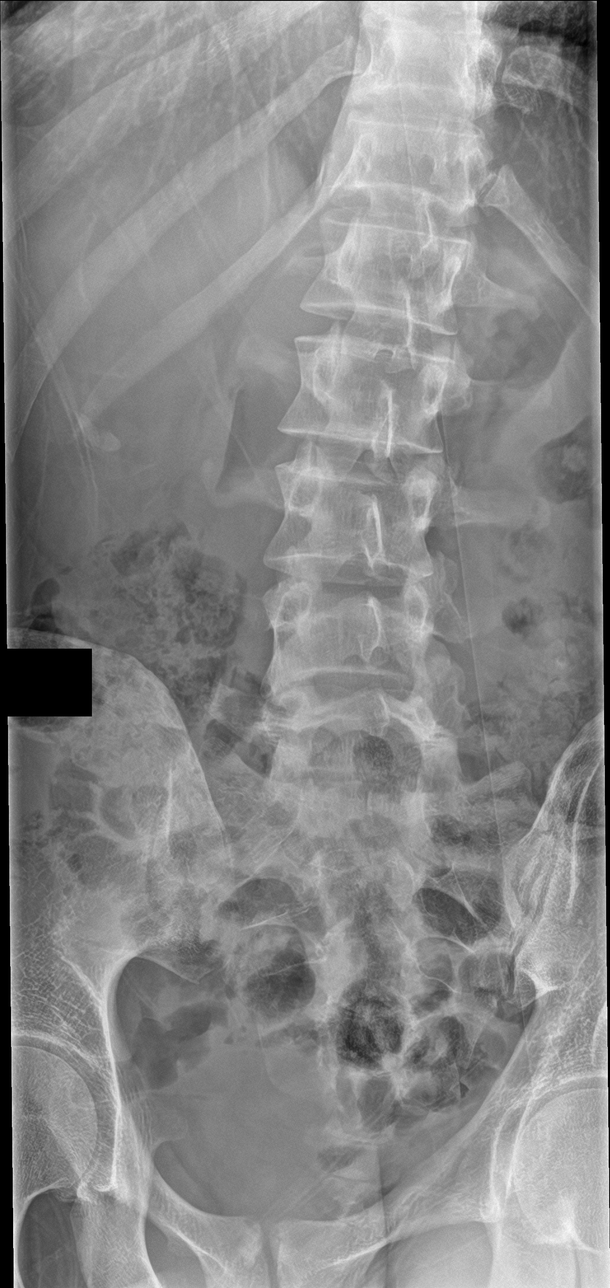

[l-spine obl (2 of 2)]
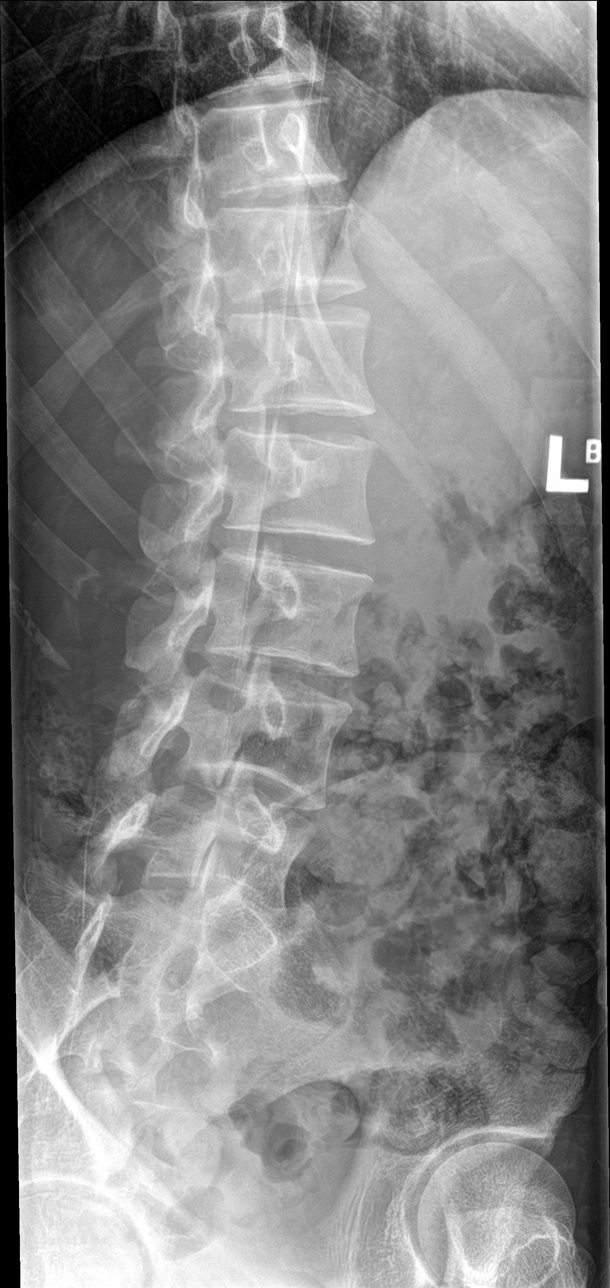

[l-spine lat]
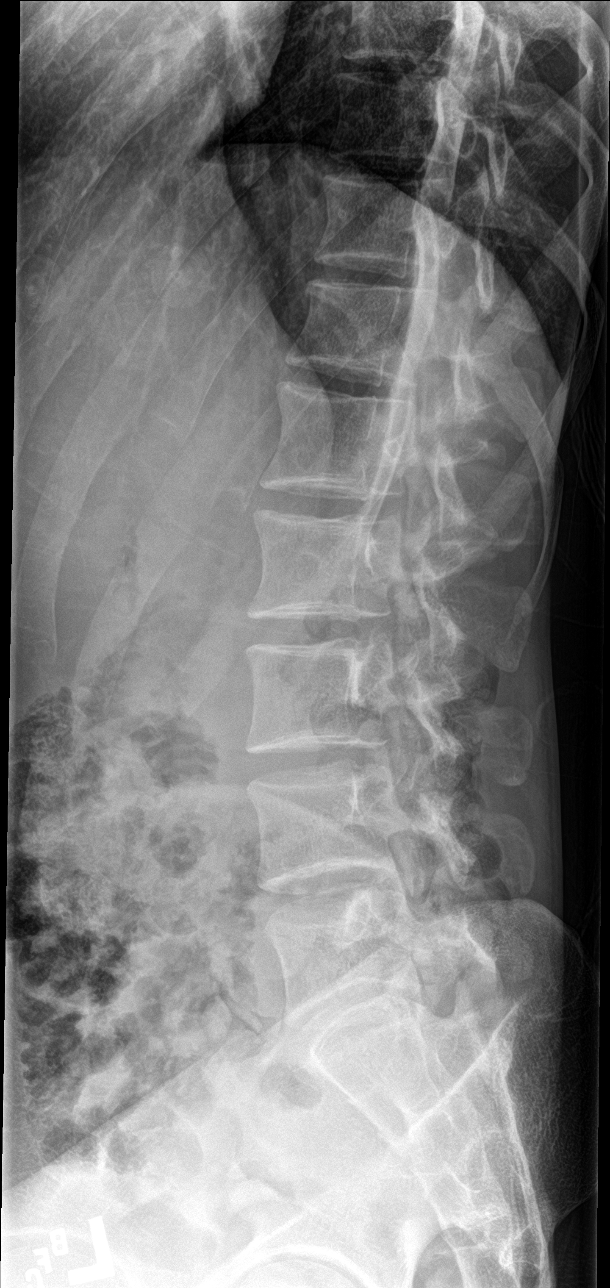

[l-spine spot]
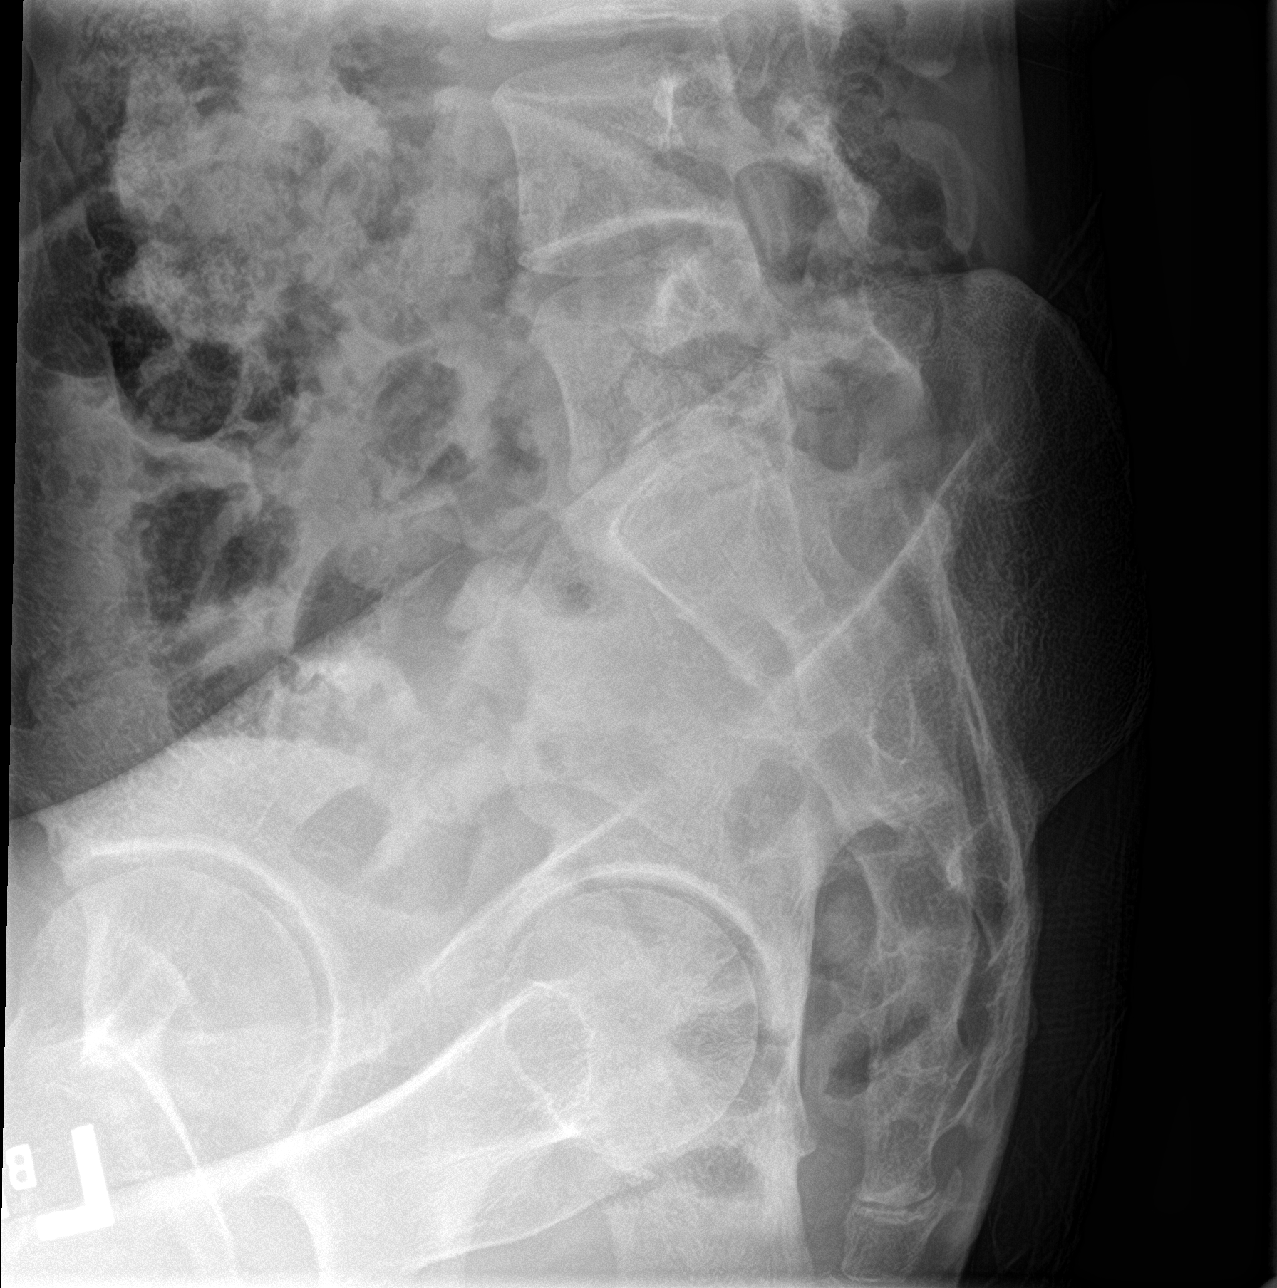

[5 of 5 positions shown; findings below may reference images not displayed]

FINDINGS: The alignment is maintained. Vertebral body heights are normal.
There is no listhesis. The posterior elements are intact. Disc
spaces are preserved. No fracture. Sacroiliac joints are symmetric
and normal.
IMPRESSION: Negative radiographs of the lumbar spine.

## 2018-07-06 IMAGING — CT CT CERVICAL SPINE W/O CM
1 of 7 series · 1 of 33 positions shown · non-contrast
Comparison: None.

CLINICAL DATA: Fall off [place], possible loss of consciousness.

EXAM:
CT HEAD WITHOUT CONTRAST
CT CERVICAL SPINE WITHOUT CONTRAST
TECHNIQUE: Multidetector CT imaging of the head and cervical spine was
performed following the standard protocol without intravenous
contrast. Multiplanar CT image reconstructions of the cervical spine
were also generated.

[Series 307: cor spine · coronal · 0.34mm/px · 1 of 40 slices shown]
[im 38/40  bone]
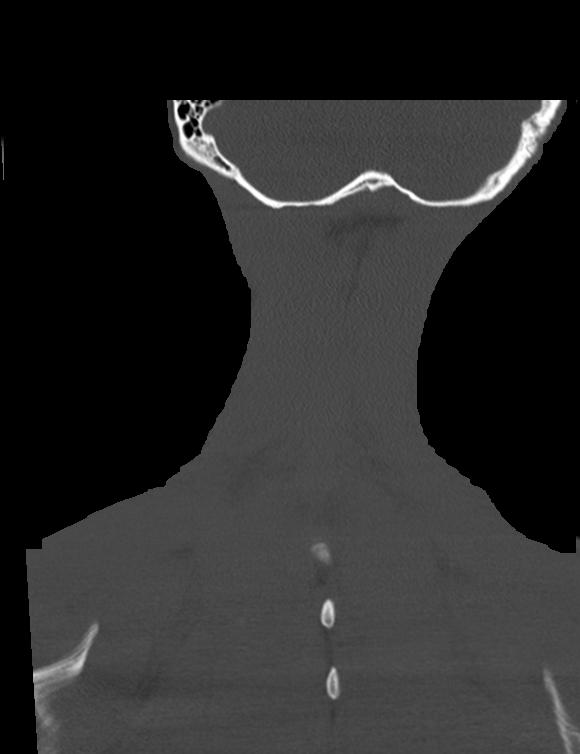

[1 of 33 positions shown; findings below may reference images not displayed]

FINDINGS: CT HEAD FINDINGS

No intracranial hemorrhage, mass effect, or midline shift. No
hydrocephalus. The basilar cisterns are patent. No evidence of
territorial infarct. No intracranial fluid collection. Calvarium is
intact. Included paranasal sinuses and mastoid air cells are well
aerated.

CT CERVICAL SPINE FINDINGS

Mild motion artifact. No fracture or acute subluxation. The dens is
intact. There are no jumped or perched facets. Vertebral body
heights and intervertebral disc spaces are maintained. No
prevertebral soft tissue edema.
IMPRESSION: 1. Unremarkable noncontrast head CT.
2. Unremarkable CT of the cervical spine.
